# Patient Record
Sex: Female | Born: 1945 | Race: Black or African American | Hispanic: No | Smoking: Never smoker
Health system: Southern US, Community
[De-identification: ages and names within clinical notes are randomized; demographics above are authoritative.]

## PROBLEM LIST (undated history)

## (undated) DIAGNOSIS — E119 Type 2 diabetes mellitus without complications: Secondary | ICD-10-CM

## (undated) DIAGNOSIS — I10 Essential (primary) hypertension: Secondary | ICD-10-CM

## (undated) DIAGNOSIS — H409 Unspecified glaucoma: Secondary | ICD-10-CM

## (undated) DIAGNOSIS — D869 Sarcoidosis, unspecified: Secondary | ICD-10-CM

## (undated) HISTORY — PX: OTHER SURGICAL HISTORY: SHX169

## (undated) HISTORY — PX: ABDOMINAL HYSTERECTOMY: SHX81

---

## 2008-09-19 ENCOUNTER — Encounter: Admission: RE | Admit: 2008-09-19 | Discharge: 2008-09-19 | Payer: Self-pay | Admitting: Internal Medicine

## 2013-01-09 ENCOUNTER — Inpatient Hospital Stay (HOSPITAL_COMMUNITY)

## 2013-01-09 ENCOUNTER — Encounter (HOSPITAL_COMMUNITY): Payer: Self-pay | Admitting: Emergency Medicine

## 2013-01-09 ENCOUNTER — Inpatient Hospital Stay (HOSPITAL_COMMUNITY)
Admission: EM | Admit: 2013-01-09 | Discharge: 2013-01-11 | DRG: 313 | Disposition: A | Discharge status: Home / Self Care | Attending: Internal Medicine | Admitting: Internal Medicine

## 2013-01-09 ENCOUNTER — Emergency Department (HOSPITAL_COMMUNITY)

## 2013-01-09 DIAGNOSIS — I4510 Unspecified right bundle-branch block: Secondary | ICD-10-CM | POA: Diagnosis present

## 2013-01-09 DIAGNOSIS — K219 Gastro-esophageal reflux disease without esophagitis: Secondary | ICD-10-CM | POA: Diagnosis present

## 2013-01-09 DIAGNOSIS — I209 Angina pectoris, unspecified: Secondary | ICD-10-CM

## 2013-01-09 DIAGNOSIS — R0789 Other chest pain: Principal | ICD-10-CM | POA: Diagnosis present

## 2013-01-09 DIAGNOSIS — E119 Type 2 diabetes mellitus without complications: Secondary | ICD-10-CM | POA: Diagnosis present

## 2013-01-09 DIAGNOSIS — I10 Essential (primary) hypertension: Secondary | ICD-10-CM | POA: Diagnosis present

## 2013-01-09 DIAGNOSIS — R072 Precordial pain: Secondary | ICD-10-CM | POA: Diagnosis present

## 2013-01-09 DIAGNOSIS — E669 Obesity, unspecified: Secondary | ICD-10-CM | POA: Diagnosis present

## 2013-01-09 DIAGNOSIS — R1013 Epigastric pain: Secondary | ICD-10-CM | POA: Diagnosis present

## 2013-01-09 DIAGNOSIS — Z8249 Family history of ischemic heart disease and other diseases of the circulatory system: Secondary | ICD-10-CM

## 2013-01-09 DIAGNOSIS — R079 Chest pain, unspecified: Secondary | ICD-10-CM | POA: Diagnosis present

## 2013-01-09 DIAGNOSIS — I519 Heart disease, unspecified: Secondary | ICD-10-CM | POA: Diagnosis present

## 2013-01-09 DIAGNOSIS — E785 Hyperlipidemia, unspecified: Secondary | ICD-10-CM | POA: Diagnosis present

## 2013-01-09 DIAGNOSIS — D869 Sarcoidosis, unspecified: Secondary | ICD-10-CM | POA: Diagnosis present

## 2013-01-09 DIAGNOSIS — I5189 Other ill-defined heart diseases: Secondary | ICD-10-CM | POA: Diagnosis present

## 2013-01-09 DIAGNOSIS — R9431 Abnormal electrocardiogram [ECG] [EKG]: Secondary | ICD-10-CM | POA: Diagnosis present

## 2013-01-09 HISTORY — DX: Unspecified glaucoma: H40.9

## 2013-01-09 HISTORY — DX: Sarcoidosis, unspecified: D86.9

## 2013-01-09 HISTORY — DX: Type 2 diabetes mellitus without complications: E11.9

## 2013-01-09 HISTORY — DX: Essential (primary) hypertension: I10

## 2013-01-09 LAB — CBC
HCT: 39.3 % (ref 36.0–46.0)
HCT: 39 % (ref 36.0–46.0)
Hemoglobin: 13.2 g/dL (ref 12.0–15.0)
MCH: 28.8 pg (ref 26.0–34.0)
MCH: 29.4 pg (ref 26.0–34.0)
MCHC: 34.1 g/dL (ref 30.0–36.0)
MCV: 86.3 fL (ref 78.0–100.0)
RBC: 4.58 MIL/uL (ref 3.87–5.11)
RDW: 13.9 % (ref 11.5–15.5)

## 2013-01-09 LAB — BASIC METABOLIC PANEL
CO2: 26 mEq/L (ref 19–32)
Glucose, Bld: 164 mg/dL — ABNORMAL HIGH (ref 70–99)
Potassium: 3.6 mEq/L (ref 3.5–5.1)
Sodium: 139 mEq/L (ref 135–145)

## 2013-01-09 LAB — POCT I-STAT TROPONIN I: Troponin i, poc: 0.01 ng/mL (ref 0.00–0.08)

## 2013-01-09 LAB — CREATININE, SERUM: GFR calc non Af Amer: 68 mL/min — ABNORMAL LOW (ref >90)

## 2013-01-09 MED ORDER — SODIUM CHLORIDE 0.9 % IV BOLUS (SEPSIS)
1000.0000 mL | Freq: Once | INTRAVENOUS | Status: AC
  Administered 2013-01-09: 1000 mL via INTRAVENOUS

## 2013-01-09 MED ORDER — ENOXAPARIN SODIUM 40 MG/0.4ML ~~LOC~~ SOLN
40.0000 mg | SUBCUTANEOUS | Status: DC
  Administered 2013-01-10 – 2013-01-11 (×2): 40 mg via SUBCUTANEOUS
  Filled 2013-01-09 (×2): qty 0.4

## 2013-01-09 MED ORDER — LOSARTAN POTASSIUM-HCTZ 50-12.5 MG PO TABS: 1.0000 | ORAL_TABLET | Freq: Every day | ORAL | Status: DC

## 2013-01-09 MED ORDER — SODIUM CHLORIDE 0.9 % IV SOLN
INTRAVENOUS | Status: DC
  Administered 2013-01-09: 1000 mL via INTRAVENOUS

## 2013-01-09 MED ORDER — ONDANSETRON HCL 4 MG PO TABS: 4.0000 mg | ORAL_TABLET | Freq: Four times a day (QID) | ORAL | Status: DC | PRN

## 2013-01-09 MED ORDER — HYDROCHLOROTHIAZIDE 12.5 MG PO CAPS
12.5000 mg | ORAL_CAPSULE | Freq: Every day | ORAL | Status: DC
  Administered 2013-01-10 – 2013-01-11 (×2): 12.5 mg via ORAL
  Filled 2013-01-09 (×2): qty 1

## 2013-01-09 MED ORDER — ASPIRIN 325 MG PO TABS
325.0000 mg | ORAL_TABLET | Freq: Every day | ORAL | Status: DC
  Administered 2013-01-10 – 2013-01-11 (×2): 325 mg via ORAL
  Filled 2013-01-09 (×2): qty 1

## 2013-01-09 MED ORDER — PANTOPRAZOLE SODIUM 40 MG PO TBEC
40.0000 mg | DELAYED_RELEASE_TABLET | Freq: Every day | ORAL | Status: DC
  Administered 2013-01-09 – 2013-01-11 (×3): 40 mg via ORAL
  Filled 2013-01-09 (×3): qty 1

## 2013-01-09 MED ORDER — ASPIRIN 81 MG PO CHEW
162.0000 mg | CHEWABLE_TABLET | Freq: Once | ORAL | Status: AC
  Administered 2013-01-09: 162 mg via ORAL
  Filled 2013-01-09: qty 2

## 2013-01-09 MED ORDER — ONDANSETRON HCL 4 MG/2ML IJ SOLN: 4.0000 mg | Freq: Four times a day (QID) | INTRAMUSCULAR | Status: DC | PRN

## 2013-01-09 MED ORDER — INSULIN ASPART 100 UNIT/ML ~~LOC~~ SOLN
0.0000 [IU] | Freq: Three times a day (TID) | SUBCUTANEOUS | Status: DC
  Administered 2013-01-11: 1 [IU] via SUBCUTANEOUS

## 2013-01-09 MED ORDER — INSULIN ASPART 100 UNIT/ML ~~LOC~~ SOLN: 0.0000 [IU] | Freq: Every day | SUBCUTANEOUS | Status: DC

## 2013-01-09 MED ORDER — NITROGLYCERIN 0.4 MG SL SUBL: 0.4000 mg | SUBLINGUAL_TABLET | SUBLINGUAL | Status: DC | PRN

## 2013-01-09 MED ORDER — SODIUM CHLORIDE 0.9 % IJ SOLN
3.0000 mL | Freq: Two times a day (BID) | INTRAMUSCULAR | Status: DC
  Administered 2013-01-09: 3 mL via INTRAVENOUS

## 2013-01-09 MED ORDER — LOSARTAN POTASSIUM 50 MG PO TABS
50.0000 mg | ORAL_TABLET | Freq: Every day | ORAL | Status: DC
  Administered 2013-01-10 – 2013-01-11 (×2): 50 mg via ORAL
  Filled 2013-01-09 (×2): qty 1

## 2013-01-09 NOTE — Progress Notes (Signed)
   CARE MANAGEMENT ED NOTE 01/09/2013  Patient:  Cheyenne, Green   Account Number:  [id number]  Date Initiated:  01/09/2013  Documentation initiated by:  Radford Pax  Subjective/Objective Assessment:   Patient presents to ED with chest pain     Subjective/Objective Assessment Detail:     Action/Plan:   Action/Plan Detail:   Anticipated DC Date:       Status Recommendation to Physician:   Result of Recommendation:    Other ED Services  Consult Working Plan    DC Planning Services  Other  PCP issues    Choice offered to / List presented to:            Status of service:  Completed, signed off  ED Comments:   ED Comments Detail:  Patient reports her pcp is Dr. Ludwig Clarks.

## 2013-01-09 NOTE — ED Notes (Signed)
Pt states that she woke up yesterday with a sore chest.  States that she feels like the muscles are strained but denies injuring it in any way.  Hx of sarcoidosis. 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  Coworkers did an EKG on her and sent her over here.  Pt states that she is always SOB due to sarcoidosis.

## 2013-01-09 NOTE — H&P (Signed)
[facility] History and Physical  Braylynn Lewing RUE:[id number] DOB: September 12, 1945 DOA: 01/09/2013  Referring physician: Dr Rhunette Croft. PCP: Ralene Ok, MD  Specialists: none  Chief Complaint: epigastric pain, substernal pain started in the morning.   HPI: Cheyenne Green is a 67 y.o. female with h/o sarcoidosis, diabetes mellitus, GERD, came in for epigastric and substernal pain  Started this morning, went to urgent care, underwent an EKG was found to have t wave inversions on lead III , avl, v2 and v3 ,. She was sent to ER, and was referred to medical service for evaluation of ACS. She is chest pain free currently, no nausea, vomiting, palpitations, sob, no orthopnea, pnd, pedal edema.   Review of Systems: The patient denies anorexia, fever, weight loss,, vision loss, decreased hearing, hoarseness, syncope, dyspnea on exertion, peripheral edema, balance deficits, hemoptysis,  melena, hematochezia, severe indigestion/heartburn, hematuria, incontinence, genital sores, muscle weakness, suspicious skin lesions, transient blindness, difficulty walking, depression, unusual weight change, abnormal bleeding, enlarged lymph nodes, angioedema, and breast masses.    Past Medical History  Diagnosis Date  . Sarcoidosis   . Diabetes mellitus without complication   . Glaucoma    Past Surgical History  Procedure Laterality Date  . Abdominal hysterectomy     Social History:  reports that she has never smoked. She does not have any smokeless tobacco history on file. She reports that she does not drink alcohol. Her drug history is not on file.  where does patient live--home, No Known Allergies  History reviewed. No pertinent family history.  Family h/o CAD in father and mother.   Prior to Admission medications   Medication Sig Start Date End Date Taking? Authorizing Provider  cholecalciferol (VITAMIN D) 1000 UNITS tablet Take 1,000 Units by mouth daily.   Yes Historical Provider, MD   losartan-hydrochlorothiazide (HYZAAR) 50-12.5 MG per tablet Take 1 tablet by mouth daily.   Yes Historical Provider, MD  metFORMIN (GLUCOPHAGE) 500 MG tablet Take 500 mg by mouth 2 (two) times daily with a meal.   Yes Historical Provider, MD  dexlansoprazole (DEXILANT) 60 MG capsule Take 60 mg by mouth daily.    Historical Provider, MD   Physical Exam: There were no vitals filed for this visit.  Constitutional: Vital signs reviewed.  Patient is a well-developed and well-nourished  in no acute distress and cooperative with exam. Alert and oriented x3.  Head: Normocephalic and atraumatic Mouth: no erythema or exudates, MMM Eyes: PERRL, EOMI, conjunctivae normal, No scleral icterus.  Neck: Supple, Trachea midline normal ROM, No JVD, mass, thyromegaly, or carotid bruit present.  Cardiovascular: RRR, S1 normal, S2 normal, no MRG, pulses symmetric and intact bilaterally Pulmonary/Chest: normal respiratory effort, CTAB, no wheezes, rales, or rhonchi Abdominal: Soft. Non-tender, non-distended, bowel sounds are normal, no masses, organomegaly, or guarding present.  Musculoskeletal: No joint deformities, erythema, or stiffness, ROM full and no nontender Neurological: A&O x3, Strength is normal and symmetric bilaterally, cranial nerve II-XII are grossly intact, no focal motor deficit, sensory intact to light touch bilaterally.  Skin: Warm, dry and intact. No rash, cyanosis, or clubbing.  Psychiatric: Normal mood and affect. speech and behavior is normal.   Labs on Admission:  Basic Metabolic Panel:  Recent Labs Lab 01/09/13 1519  NA 139  K 3.6  CL 102  CO2 26  GLUCOSE 164*  BUN 18  CREATININE 0.89  CALCIUM 9.6   Liver Function Tests: No results found for this basename: AST, ALT, ALKPHOS, BILITOT, PROT, ALBUMIN,  in the last 168 hours  No results found for this basename: LIPASE, AMYLASE,  in the last 168 hours No results found for this basename: AMMONIA,  in the last 168  hours CBC:  Recent Labs Lab 01/09/13 1519  WBC 8.2  HGB 13.2  HCT 39.3  MCV 85.8  PLT 207   Cardiac Enzymes: No results found for this basename: CKTOTAL, CKMB, CKMBINDEX, TROPONINI,  in the last 168 hours  BNP (last 3 results) No results found for this basename: PROBNP,  in the last 8760 hours CBG: No results found for this basename: GLUCAP,  in the last 168 hours  Radiological Exams on Admission: No results found.  EKG: sinus rhythm, RBBB, with t wave inversions in  III, avl, v3 v2.   Assessment/Plan Active Problems:  1.  Chest Pain:  - atypical in nature.  - admit to telemetry.  - aspirin, serial troponins, EKG in am.  - echocardiogram ordered.  - PPI ordered.  -  2. Diabetes Mellitus:  - SSI,  - Hgba1c.   3. GERD; PPI.   4. DVT prophylaxis.   Code Status: full code Family Communication: none at bedside Disposition Plan: pending.   Time spent: 60 min  Royelle Hinchman [facility] Pager [phone]  If 7PM-7AM, please contact night-coverage [email or web address] Password [facility] 01/09/2013, 6:46 PM

## 2013-01-09 NOTE — ED Notes (Signed)
Attempted to call report to floor RN will call back in 5 minutes.

## 2013-01-09 NOTE — ED Provider Notes (Signed)
History    CSN: [id number] Arrival date & time 01/09/13  1446  First MD Initiated Contact with Patient 01/09/13 1508     Chief Complaint  Patient presents with  . Chest Pain   (Consider location/radiation/quality/duration/timing/severity/associated sxs/prior Treatment) HPI Comments: Pt with hx of DM, Sarcoidosis, HTN, family hx of CAD comes in with cc of chest soreness. Pt has mid sternal chest discomfort, described as soreness, that has not gotten better since this morning. The pain is non radiating. There was some nausea in the AM, but no emesis, no diaphoresis. The pain is not exertional, worse with deep inspirations. She always has exertional dib, which she attributes to her sarcoidosis. No hx of PE, DVT, wheezing, cough.  Patient is a 67 y.o. female presenting with chest pain. The history is provided by the patient.  Chest Pain Associated symptoms: shortness of breath   Associated symptoms: no abdominal pain, no cough, no nausea and not vomiting    Past Medical History  Diagnosis Date  . Sarcoidosis   . Diabetes mellitus without complication   . Glaucoma    Past Surgical History  Procedure Laterality Date  . Abdominal hysterectomy     History reviewed. No pertinent family history. History  Substance Use Topics  . Smoking status: Never Smoker   . Smokeless tobacco: Not on file  . Alcohol Use: No   OB History   Grav Para Term Preterm Abortions TAB SAB Ect Mult Living                 Review of Systems  Constitutional: Negative for activity change.  HENT: Negative for facial swelling and neck pain.   Respiratory: Positive for shortness of breath. Negative for cough and wheezing.   Cardiovascular: Positive for chest pain.  Gastrointestinal: Negative for nausea, vomiting, abdominal pain, diarrhea, constipation, blood in stool and abdominal distention.  Genitourinary: Negative for hematuria and difficulty urinating.  Skin: Negative for color change.  Neurological:  Negative for speech difficulty.  Hematological: Does not bruise/bleed easily.  Psychiatric/Behavioral: Negative for confusion.    Allergies  Review of patient's allergies indicates no known allergies.  Home Medications   Current Outpatient Rx  Name  Route  Sig  Dispense  Refill  . cholecalciferol (VITAMIN D) 1000 UNITS tablet   Oral   Take 1,000 Units by mouth daily.         Marland Kitchen losartan-hydrochlorothiazide (HYZAAR) 50-12.5 MG per tablet   Oral   Take 1 tablet by mouth daily.         . metFORMIN (GLUCOPHAGE) 500 MG tablet   Oral   Take 500 mg by mouth 2 (two) times daily with a meal.         . dexlansoprazole (DEXILANT) 60 MG capsule   Oral   Take 60 mg by mouth daily.          There were no vitals taken for this visit. Physical Exam  Nursing note and vitals reviewed. Constitutional: She is oriented to person, place, and time. She appears well-developed and well-nourished.  HENT:  Head: Normocephalic and atraumatic.  Eyes: EOM are normal. Pupils are equal, round, and reactive to light.  Neck: Neck supple. No JVD present.  Cardiovascular: Normal rate, regular rhythm and normal heart sounds.   No murmur heard. Pulmonary/Chest: Effort normal. No respiratory distress. She has no wheezes.  Abdominal: Soft. She exhibits no distension. There is no tenderness. There is no rebound and no guarding.  Neurological: She is alert and  oriented to person, place, and time.  Skin: Skin is warm and dry.    ED Course  Procedures (including critical care time) Labs Reviewed  CBC  BASIC METABOLIC PANEL  D-DIMER, QUANTITATIVE   No results found. No diagnosis found.  MDM   Date: 01/09/2013  Rate: 71  Rhythm: normal sinus rhythm  QRS Axis: normal  Intervals: normal  ST/T Wave abnormalities: nonspecific ST/T changes  Conduction Disutrbances:nonspecific intraventricular conduction delay  Narrative Interpretation:   Old EKG Reviewed: none available  Differential diagnosis  includes: ACS syndrome CHF exacerbation Valvular disorder Myocarditis Pericarditis Pericardial effusion Pneumonia Pleural effusion Pulmonary edema PE Anemia Musculoskeletal pain  Pt comes in with cc of chest pain -described as soreness. She has DM, HTN, Family hx and age as her cardiac risk factors. The pain is atypical, it does have some pleuritic component to it - and she ha sarcoidosis - which if there is an element of pulm HTN, puts her at risk for PE, so we will get a dimer. EKG is not reassuring, as there is incomplete RBBB, and t wave inversions. Will admit for ACS r/o.      Derwood Kaplan, MD 01/09/13 [phone]

## 2013-01-09 NOTE — ED Notes (Signed)
Pt had set of troponin's done. Second one checked off as completed in error.

## 2013-01-10 DIAGNOSIS — E669 Obesity, unspecified: Secondary | ICD-10-CM | POA: Diagnosis present

## 2013-01-10 DIAGNOSIS — E785 Hyperlipidemia, unspecified: Secondary | ICD-10-CM | POA: Diagnosis present

## 2013-01-10 DIAGNOSIS — I517 Cardiomegaly: Secondary | ICD-10-CM

## 2013-01-10 DIAGNOSIS — I209 Angina pectoris, unspecified: Secondary | ICD-10-CM

## 2013-01-10 DIAGNOSIS — I10 Essential (primary) hypertension: Secondary | ICD-10-CM | POA: Diagnosis present

## 2013-01-10 DIAGNOSIS — R9431 Abnormal electrocardiogram [ECG] [EKG]: Secondary | ICD-10-CM | POA: Diagnosis present

## 2013-01-10 DIAGNOSIS — I519 Heart disease, unspecified: Secondary | ICD-10-CM

## 2013-01-10 DIAGNOSIS — Z8249 Family history of ischemic heart disease and other diseases of the circulatory system: Secondary | ICD-10-CM

## 2013-01-10 DIAGNOSIS — D869 Sarcoidosis, unspecified: Secondary | ICD-10-CM | POA: Diagnosis present

## 2013-01-10 DIAGNOSIS — I5189 Other ill-defined heart diseases: Secondary | ICD-10-CM | POA: Diagnosis present

## 2013-01-10 LAB — LIPID PANEL
Cholesterol: 221 mg/dL — ABNORMAL HIGH (ref 0–200)
HDL: 46 mg/dL (ref >39)
LDL Cholesterol: 148 mg/dL — ABNORMAL HIGH (ref 0–99)
Triglycerides: 134 mg/dL (ref <150)
VLDL: 27 mg/dL (ref 0–40)

## 2013-01-10 LAB — TSH: TSH: 3.534 u[IU]/mL (ref 0.350–4.500)

## 2013-01-10 LAB — TROPONIN I: Troponin I: <0.3 ng/mL (ref <0.30)

## 2013-01-10 LAB — HEMOGLOBIN A1C
Hgb A1c MFr Bld: 5.9 % — ABNORMAL HIGH (ref <5.7)
Mean Plasma Glucose: 123 mg/dL — ABNORMAL HIGH (ref <117)

## 2013-01-10 LAB — GLUCOSE, CAPILLARY

## 2013-01-10 MED ORDER — ACETAMINOPHEN 325 MG PO TABS: 650.0000 mg | ORAL_TABLET | Freq: Four times a day (QID) | ORAL | Status: DC | PRN

## 2013-01-10 NOTE — Progress Notes (Signed)
[facility] PROGRESS NOTE  Cheyenne Green ZOX:[id number] DOB: 08-29-1945 DOA: 01/09/2013  PCP: Ralene Ok, MD  Brief HPI: Cheyenne Green is a 67 y.o. female with h/o sarcoidosis, diabetes mellitus, GERD, came in for epigastric and substernal pain that sarted the morning of admission. She works at [employer] and had an EKG done while she was there and was found to have T wave inversions on lead III , avl, v2 and v3. She was sent to ER, and was referred to medical service for evaluation of ACS.   Past medical history:  Past Medical History  Diagnosis Date  . Sarcoidosis   . Diabetes mellitus without complication   . Glaucoma   . Hypertension     preventative    Consultants: [facility]  Procedures: ECHO is pending  Antibiotics: None  Subjective: Patient denies any symptoms currently. No chest pain. But she does mention fatigue for last 2 weeks and epigastric tightness as well. Is chronically short of breath due to sarcoidosis but walks 6 miles on weekends. Denies leg swelling. Last stress test was 5 years ago in [place].  Objective: Vital Signs  Filed Vitals:   01/09/13 1505 01/09/13 1902 01/09/13 2029 01/10/13 0645  BP: 131/72 139/82 152/74 116/68  Pulse: 68 70 85 67  Temp: 98.1 F (36.7 C) 98 F (36.7 C) 97.5 F (36.4 C) 98.1 F (36.7 C)  TempSrc: Oral  Oral Oral  Resp: 18 18 20 18   Height:   5\' 3"  (1.6 m)   Weight:   82.056 kg (180 lb 14.4 oz)   SpO2: 98% 96% 99% 97%    Intake/Output Summary (Last 24 hours) at 01/10/13 0827 Last data filed at 01/10/13 0645  Gross per 24 hour  Intake      0 ml  Output   1400 ml  Net  -1400 ml   Filed Weights   01/09/13 2029  Weight: 82.056 kg (180 lb 14.4 oz)   General appearance: alert, cooperative, appears stated age, no distress and moderately obese Head: Normocephalic, without obvious abnormality, atraumatic Eyes: Redness noted in right eye. Back: symmetric, no curvature. ROM normal. No CVA tenderness. Resp: clear to  auscultation bilaterally Cardio: regular rate and rhythm, S1, S2 normal, no murmur, click, rub or gallop GI: soft, non-tender; bowel sounds normal; no masses,  no organomegaly Extremities: extremities normal, atraumatic, no cyanosis or edema Pulses: 2+ and symmetric Neurologic: Alert and oriented x 3. No focal deficits.  Lab Results:  Basic Metabolic Panel:  Recent Labs Lab 01/09/13 1519 01/09/13 2052  NA 139  --   K 3.6  --   CL 102  --   CO2 26  --   GLUCOSE 164*  --   BUN 18  --   CREATININE 0.89 0.88  CALCIUM 9.6  --    CBC:  Recent Labs Lab 01/09/13 1519 01/09/13 2052  WBC 8.2 8.8  HGB 13.2 13.3  HCT 39.3 39.0  MCV 85.8 86.3  PLT 207 203   Cardiac Enzymes:  Recent Labs Lab 01/09/13 2052 01/10/13 0225 01/10/13 0755  TROPONINI <0.30 <0.30 <0.30   BNP (last 3 results) No results found for this basename: PROBNP,  in the last 8760 hours CBG:  Recent Labs Lab 01/09/13 2054 01/10/13 0754  GLUCAP 119* 99    No results found for this or any previous visit (from the past 240 hour(s)).    Studies/Results: X-ray Chest Pa And Lateral   01/09/2013   *RADIOLOGY REPORT*  Clinical Data: Chest pain.  CHEST - 2 VIEW  Comparison: Chest 09/19/2008.  Findings: Lungs are clear.  Heart size is normal.  No pneumothorax or pleural fluid.  IMPRESSION: No acute disease.   Original Report Authenticated By: Holley Dexter, M.D.    Medications:  Scheduled: . aspirin  325 mg Oral Daily  . enoxaparin (LOVENOX) injection  40 mg Subcutaneous Q24H  . losartan  50 mg Oral Daily   And  . hydrochlorothiazide  12.5 mg Oral Daily  . insulin aspart  0-5 Units Subcutaneous QHS  . insulin aspart  0-9 Units Subcutaneous TID WC  . pantoprazole  40 mg Oral Daily  . sodium chloride  3 mL Intravenous Q12H   Continuous: . sodium chloride 1,000 mL (01/09/13 2225)   ZOX:[id number], ondansetron (ZOFRAN) IV, ondansetron  Assessment/Plan:  Active Problems:   Chest pain,  unspecified   Diabetes   GERD (gastroesophageal reflux disease)    Chest pain Has rule out for ACS. But she does have risk factors for CAD and has T inversions on EKG. She will need further testing to rule out CAD. Will consult cardiology. Timing of testing to be determined by them. Continue aspirin. Check Lipid panel.  Diabetes Mellitus Type 2 SSI. Holding Metformin. AIC 5.9.  History of Hypertension Continue current meds. Monitor Bp closely.  History of Sarcoidosis Stable. Used to be on Prednisone in past but not currently.  Code Status: Full Code  DVT Prophylaxis:   Enoxaparin Family Communication: Discussed with patient. Disposition Plan: Home when ready.    LOS: 1 day   [facility]  [facility] Pager [phone] 01/10/2013, 8:27 AM  If 8PM-8AM, please contact night-coverage at [email or web address], password [facility]

## 2013-01-10 NOTE — Progress Notes (Signed)
*  PRELIMINARY RESULTS* Echocardiogram 2D Echocardiogram has been performed.  Jeryl Columbia 01/10/2013, 9:58 AM

## 2013-01-10 NOTE — Consult Note (Signed)
Reason for Consult: Chest pain  Requesting Physician: Claybon Jabs  HPI: This is a 67 y.o. female followed by Dr Ralene Ok with a past medical history significant for DM, HTN, untreated dyslipidemia, sarcoidosis, and a family history of early CAD, we are asked to see for chest pain. Pt was admitted 01/09/13 after she developed epigastric pain and "soreness". This actually woke her up. She also complained of a bandlike chest discomfort. Her EKG showed a new incomplete RBBB. She was admitted for further evaluation. Troponin are negative. She says today she  Has chest wall tenderness. She denies any unusual SOB or radiation to her arms or jaw.   PMHx:  Past Medical History  Diagnosis Date  . Sarcoidosis   . Diabetes mellitus without complication   . Glaucoma   . Hypertension     preventative   Past Surgical History  Procedure Laterality Date  . Abdominal hysterectomy    . Retinal detachment, retinal bleed,       see eye specialist every 3 month  . Uv-idititis      FAMHx: History reviewed. No pertinent family history.  SOCHx:  reports that she has never smoked. She does not have any smokeless tobacco history on file. She reports that she does not drink alcohol. Her drug history is not on file.  ALLERGIES: Allergies  Allergen Reactions  . Statins     Myalgia, muscle weakness    ROS: Pertinent items are noted in HPI. She also complained to me of constipation, bloating after meals, and neuropathy "tingling" in her feet  HOME MEDICATIONS: Prescriptions prior to admission  Medication Sig Dispense Refill  . cholecalciferol (VITAMIN D) 1000 UNITS tablet Take 1,000 Units by mouth daily.      Marland Kitchen losartan-hydrochlorothiazide (HYZAAR) 50-12.5 MG per tablet Take 1 tablet by mouth daily.      . metFORMIN (GLUCOPHAGE) 500 MG tablet Take 500 mg by mouth 2 (two) times daily with a meal.      . dexlansoprazole (DEXILANT) 60 MG capsule Take 60 mg by mouth daily.        HOSPITAL  MEDICATIONS: I have reviewed the patient's current medications.  VITALS: Blood pressure 134/62, pulse 64, temperature 98.1 F (36.7 C), temperature source Oral, resp. rate 18, height 5\' 3"  (1.6 m), weight 82.056 kg (180 lb 14.4 oz), SpO2 98.00%.  PHYSICAL EXAM: (Per Dr Allyson Sabal) General appearance: alert, cooperative, appears stated age, no distress and moderately obese Neck: no carotid bruit and no JVD Lungs: clear to auscultation bilaterally Heart: regular rate and rhythm Abdomen: obese, non tender Extremities: extremities normal, atraumatic, no cyanosis or edema Pulses: 2+ and symmetric Skin: Skin color, texture, turgor normal. No rashes or lesions Neurologic: Grossly normal  LABS: Results for orders placed during the hospital encounter of 01/09/13 (from the past 48 hour(s))  CBC     Status: None   Collection Time    01/09/13  3:19 PM      Result Value Range   WBC 8.2  4.0 - 10.5 K/uL   RBC 4.58  3.87 - 5.11 MIL/uL   Hemoglobin 13.2  12.0 - 15.0 g/dL   HCT 09.8  11.9 - 14.7 %   MCV 85.8  78.0 - 100.0 fL   MCH 28.8  26.0 - 34.0 pg   MCHC 33.6  30.0 - 36.0 g/dL   RDW 82.9  56.2 - 13.0 %   Platelets 207  150 - 400 K/uL  BASIC METABOLIC PANEL     Status: Abnormal  Collection Time    01/09/13  3:19 PM      Result Value Range   Sodium 139  135 - 145 mEq/L   Potassium 3.6  3.5 - 5.1 mEq/L   Chloride 102  96 - 112 mEq/L   CO2 26  19 - 32 mEq/L   Glucose, Bld 164 (*) 70 - 99 mg/dL   BUN 18  6 - 23 mg/dL   Creatinine, Ser 1.61  0.50 - 1.10 mg/dL   Calcium 9.6  8.4 - 09.6 mg/dL   GFR calc non Af Amer 67 (*) >90 mL/min   GFR calc Af Amer 78 (*) >90 mL/min   Comment:            The eGFR has been calculated     using the CKD EPI equation.     This calculation has not been     validated in all clinical     situations.     eGFR's persistently     <90 mL/min signify     possible Chronic Kidney Disease.  D-DIMER, QUANTITATIVE     Status: None   Collection Time    01/09/13   3:19 PM      Result Value Range   D-Dimer, Quant <0.27  0.00 - 0.48 ug/mL-FEU   Comment:            AT THE INHOUSE ESTABLISHED CUTOFF     VALUE OF 0.48 ug/mL FEU,     THIS ASSAY HAS BEEN DOCUMENTED     IN THE LITERATURE TO HAVE     A SENSITIVITY AND NEGATIVE     PREDICTIVE VALUE OF AT LEAST     98 TO 99%.  THE TEST RESULT     SHOULD BE CORRELATED WITH     AN ASSESSMENT OF THE CLINICAL     PROBABILITY OF DVT / VTE.  POCT I-STAT TROPONIN I     Status: None   Collection Time    01/09/13  3:27 PM      Result Value Range   Troponin i, poc 0.01  0.00 - 0.08 ng/mL   Comment 3            Comment: Due to the release kinetics of cTnI,     a negative result within the first hours     of the onset of symptoms does not rule out     myocardial infarction with certainty.     If myocardial infarction is still suspected,     repeat the test at appropriate intervals.  PREGNANCY, URINE     Status: None   Collection Time    01/09/13  5:14 PM      Result Value Range   Preg Test, Ur NEGATIVE  NEGATIVE   Comment:            THE SENSITIVITY OF THIS     METHODOLOGY IS >20 mIU/mL.  CBC     Status: None   Collection Time    01/09/13  8:52 PM      Result Value Range   WBC 8.8  4.0 - 10.5 K/uL   RBC 4.52  3.87 - 5.11 MIL/uL   Hemoglobin 13.3  12.0 - 15.0 g/dL   HCT 04.5  40.9 - 81.1 %   MCV 86.3  78.0 - 100.0 fL   MCH 29.4  26.0 - 34.0 pg   MCHC 34.1  30.0 - 36.0 g/dL   RDW 91.4  78.2 - 95.6 %  Platelets 203  150 - 400 K/uL  CREATININE, SERUM     Status: Abnormal   Collection Time    01/09/13  8:52 PM      Result Value Range   Creatinine, Ser 0.88  0.50 - 1.10 mg/dL   GFR calc non Af Amer 68 (*) >90 mL/min   GFR calc Af Amer 79 (*) >90 mL/min   Comment:            The eGFR has been calculated     using the CKD EPI equation.     This calculation has not been     validated in all clinical     situations.     eGFR's persistently     <90 mL/min signify     possible Chronic Kidney  Disease.  TROPONIN I     Status: None   Collection Time    01/09/13  8:52 PM      Result Value Range   Troponin I <0.30  <0.30 ng/mL   Comment:            Due to the release kinetics of cTnI,     a negative result within the first hours     of the onset of symptoms does not rule out     myocardial infarction with certainty.     If myocardial infarction is still suspected,     repeat the test at appropriate intervals.  TSH     Status: None   Collection Time    01/09/13  8:52 PM      Result Value Range   TSH 3.534  0.350 - 4.500 uIU/mL  HEMOGLOBIN A1C     Status: Abnormal   Collection Time    01/09/13  8:52 PM      Result Value Range   Hemoglobin A1C 5.9 (*) <5.7 %   Comment: (NOTE)                                                                               According to the ADA Clinical Practice Recommendations for 2011, when     HbA1c is used as a screening test:      >=6.5%   Diagnostic of Diabetes Mellitus               (if abnormal result is confirmed)     5.7-6.4%   Increased risk of developing Diabetes Mellitus     References:Diagnosis and Classification of Diabetes Mellitus,Diabetes     Care,2011,34(Suppl 1):S62-S69 and Standards of Medical Care in             Diabetes - 2011,Diabetes Care,2011,34 (Suppl 1):S11-S61.   Mean Plasma Glucose 123 (*) <117 mg/dL  GLUCOSE, CAPILLARY     Status: Abnormal   Collection Time    01/09/13  8:54 PM      Result Value Range   Glucose-Capillary 119 (*) 70 - 99 mg/dL  TROPONIN I     Status: None   Collection Time    01/10/13  2:25 AM      Result Value Range   Troponin I <0.30  <0.30 ng/mL   Comment:  Due to the release kinetics of cTnI,     a negative result within the first hours     of the onset of symptoms does not rule out     myocardial infarction with certainty.     If myocardial infarction is still suspected,     repeat the test at appropriate intervals.  GLUCOSE, CAPILLARY     Status: None   Collection Time     01/10/13  7:54 AM      Result Value Range   Glucose-Capillary 99  70 - 99 mg/dL  TROPONIN I     Status: None   Collection Time    01/10/13  7:55 AM      Result Value Range   Troponin I <0.30  <0.30 ng/mL   Comment:            Due to the release kinetics of cTnI,     a negative result within the first hours     of the onset of symptoms does not rule out     myocardial infarction with certainty.     If myocardial infarction is still suspected,     repeat the test at appropriate intervals.  LIPID PANEL     Status: Abnormal   Collection Time    01/10/13  7:55 AM      Result Value Range   Cholesterol 221 (*) 0 - 200 mg/dL   Triglycerides 782  <956 mg/dL   HDL 46  >21 mg/dL   Total CHOL/HDL Ratio 4.8     VLDL 27  0 - 40 mg/dL   LDL Cholesterol 308 (*) 0 - 99 mg/dL   Comment:            Total Cholesterol/HDL:CHD Risk     Coronary Heart Disease Risk Table                         Men   Women      1/2 Average Risk   3.4   3.3      Average Risk       5.0   4.4      2 X Average Risk   9.6   7.1      3 X Average Risk  23.4   11.0                Use the calculated Patient Ratio     above and the CHD Risk Table     to determine the patient's CHD Risk.                ATP III CLASSIFICATION (LDL):      <100     mg/dL   Optimal      657-846  mg/dL   Near or Above                        Optimal      130-159  mg/dL   Borderline      962-952  mg/dL   High      >841     mg/dL   Very High  GLUCOSE, CAPILLARY     Status: None   Collection Time    01/10/13 12:01 PM      Result Value Range   Glucose-Capillary 85  70 - 99 mg/dL    EKG: NSR, incomplete RBBB  IMAGING: X-ray Chest Pa And Lateral   01/09/2013   *  RADIOLOGY REPORT*  Clinical Data: Chest pain.  CHEST - 2 VIEW  Comparison: Chest 09/19/2008.  Findings: Lungs are clear.  Heart size is normal.  No pneumothorax or pleural fluid.  IMPRESSION: No acute disease.   Original Report Authenticated By: Holley Dexter, M.D.     IMPRESSION: Principal Problem:   Chest pain with moderate risk of acute coronary syndrome Active Problems:   Diabetes   Diastolic dysfunction, grade 2 by echo 01/10/13   HTN (hypertension)   Nonspecific abnormal EKG- incomplete RBBB (new)   GERD (gastroesophageal reflux disease)   Dyslipidemia- LDL 140, untreated with statin intol   Sarcoidosis   Family history of coronary artery disease, (F died 60)   Obesity, unspecified   RECOMMENDATION: Pt seen by Dr Allyson Sabal and myself. He recommends [facility] and this will be arranged for 7/3 am.  Time Spent Directly with Patient: 40 minutes  Abelino Derrick [phone] beeper 01/10/2013, 12:28 PM   Agree with note written by Corine Shelter PAC  Atyp CP with + CRF. Enz neg. EKG with new RBBB. Exam benign. Will check d-dimer. Plan MPI tomorrow.  Runell Gess 01/10/2013 12:39 PM

## 2013-01-10 NOTE — Care Management Note (Signed)
    Page 1 of 1   01/10/2013     12:12:18 PM   CARE MANAGEMENT NOTE 01/10/2013  Patient:  Cheyenne Green, Cheyenne Green   Account Number:  [id number]  Date Initiated:  01/10/2013  Documentation initiated by:  Lanier Clam  Subjective/Objective Assessment:   ADMITTED W/CHEST PAIN.T WAVE INVERSION.     Action/Plan:   FROM HOME.   Anticipated DC Date:  01/12/2013   Anticipated DC Plan:  HOME/SELF CARE      DC Planning Services  CM consult      Choice offered to / List presented to:             Status of service:  In process, will continue to follow Medicare Important Message given?   (If response is "NO", the following Medicare IM given date [place] will be blank) Date Medicare IM given:   Date Additional Medicare IM given:    Discharge Disposition:    Per UR Regulation:  Reviewed for med. necessity/level of care/duration of stay  If discussed at Long Length of Stay Meetings, dates discussed:    Comments:  01/10/13 [facility] RN,BSN NCM [phone]

## 2013-01-11 ENCOUNTER — Other Ambulatory Visit: Payer: Self-pay

## 2013-01-11 ENCOUNTER — Inpatient Hospital Stay (HOSPITAL_COMMUNITY)
Admit: 2013-01-11 | Discharge: 2013-01-11 | Disposition: A | Discharge status: Home / Self Care | Attending: Cardiology | Admitting: Cardiology

## 2013-01-11 DIAGNOSIS — D869 Sarcoidosis, unspecified: Secondary | ICD-10-CM

## 2013-01-11 LAB — BASIC METABOLIC PANEL
BUN: 17 mg/dL (ref 6–23)
Chloride: 103 mEq/L (ref 96–112)
GFR calc Af Amer: 82 mL/min — ABNORMAL LOW (ref >90)
Glucose, Bld: 124 mg/dL — ABNORMAL HIGH (ref 70–99)
Potassium: 3.9 mEq/L (ref 3.5–5.1)
Sodium: 139 mEq/L (ref 135–145)

## 2013-01-11 LAB — GLUCOSE, CAPILLARY

## 2013-01-11 MED ORDER — ASPIRIN EC 81 MG PO TBEC: 81.0000 mg | DELAYED_RELEASE_TABLET | Freq: Every day | ORAL | Status: AC

## 2013-01-11 MED ORDER — REGADENOSON 0.4 MG/5ML IV SOLN
INTRAVENOUS | Status: AC
  Filled 2013-01-11: qty 5

## 2013-01-11 MED ORDER — REGADENOSON 0.4 MG/5ML IV SOLN
0.4000 mg | Freq: Once | INTRAVENOUS | Status: AC
  Administered 2013-01-11: 0.4 mg via INTRAVENOUS

## 2013-01-11 MED ORDER — TECHNETIUM TC 99M SESTAMIBI GENERIC - CARDIOLITE
10.0000 | Freq: Once | INTRAVENOUS | Status: AC | PRN
  Administered 2013-01-11: 10 via INTRAVENOUS

## 2013-01-11 MED ORDER — TECHNETIUM TC 99M SESTAMIBI GENERIC - CARDIOLITE
30.0000 | Freq: Once | INTRAVENOUS | Status: AC | PRN
  Administered 2013-01-11: 30 via INTRAVENOUS

## 2013-01-11 NOTE — Progress Notes (Addendum)
[facility] PROGRESS NOTE  [name] ZOX:[id number] DOB: 06-Sep-1945 DOA: 01/09/2013  PCP: Ralene Ok, MD  Brief HPI: Cheyenne Green is a 67 y.o. female with h/o sarcoidosis, diabetes mellitus, GERD, came in for epigastric and substernal pain that sarted the morning of admission. She works at [employer] and had an EKG done while she was there and was found to have T wave inversions on lead III , avl, v2 and v3. She was sent to ER, and was referred to medical service for evaluation of ACS.   Past medical history:  Past Medical History  Diagnosis Date  . Sarcoidosis   . Diabetes mellitus without complication   . Glaucoma   . Hypertension     preventative    Consultants: [facility]  Procedures:  2D ECHO 7/2 Study Conclusions - Left ventricle: The cavity size was normal. Wall thickness was increased in a pattern of mild LVH. Systolic function was normal. The estimated ejection fraction was in the range of 55% to 60%. Wall motion was normal; there were no regional wall motion abnormalities. Features are consistent with a pseudonormal left ventricular filling pattern, with concomitant abnormal relaxation and increased filling pressure (grade 2 diastolic dysfunction). - Aortic valve: There was no stenosis. - Mitral valve: Trivial regurgitation. - Left atrium: The atrium was mildly dilated. - Right ventricle: The cavity size was normal. Systolic function was normal. - Pulmonary arteries: No complete TR doppler jet so unable to estimate PA systolic pressure. - Inferior vena cava: The vessel was normal in size; the respirophasic diameter changes were in the normal range (= 50%); findings are consistent with normal central venous pressure.   Antibiotics: None  Subjective: Patient denies any chest pain. No events overnight.   Objective: Vital Signs  Filed Vitals:   01/10/13 1100 01/10/13 1347 01/10/13 2145 01/11/13 0607  BP: 134/62 138/77 120/62 138/73  Pulse: 64 67 77 67  Temp:  98.1 F (36.7 C) 98.2 F (36.8 C) 97.3 F (36.3 C) 98.2 F (36.8 C)  TempSrc: Oral Oral Oral Oral  Resp:  18 18 18   Height:      Weight:      SpO2: 98% 100% 96% 99%    Intake/Output Summary (Last 24 hours) at 01/11/13 0726 Last data filed at 01/11/13 8119  Gross per 24 hour  Intake 1507.5 ml  Output   3750 ml  Net -2242.5 ml   Filed Weights   01/09/13 2029  Weight: 82.056 kg (180 lb 14.4 oz)   General appearance: alert, cooperative, appears stated age, no distress and moderately obese Eyes: Redness noted in right eye. Resp: clear to auscultation bilaterally Cardio: regular rate and rhythm, S1, S2 normal, no murmur, click, rub or gallop GI: soft, non-tender; bowel sounds normal; no masses,  no organomegaly Extremities: extremities normal, atraumatic, no cyanosis or edema Neurologic: Alert and oriented x 3. No focal deficits.  Lab Results:  Basic Metabolic Panel:  Recent Labs Lab 01/09/13 1519 01/09/13 2052  NA 139  --   K 3.6  --   CL 102  --   CO2 26  --   GLUCOSE 164*  --   BUN 18  --   CREATININE 0.89 0.88  CALCIUM 9.6  --    CBC:  Recent Labs Lab 01/09/13 1519 01/09/13 2052  WBC 8.2 8.8  HGB 13.2 13.3  HCT 39.3 39.0  MCV 85.8 86.3  PLT 207 203   Cardiac Enzymes:  Recent Labs Lab 01/09/13 2052 01/10/13 0225 01/10/13 0755  TROPONINI <0.30 <0.30 <0.30   BNP (last 3 results) No results found for this basename: PROBNP,  in the last 8760 hours CBG:  Recent Labs Lab 01/09/13 2054 01/10/13 0754 01/10/13 1201 01/10/13 1709 01/10/13 2141  GLUCAP 119* 99 85 107* 118*    No results found for this or any previous visit (from the past 240 hour(s)).    Studies/Results: X-ray Chest Pa And Lateral   01/09/2013   *RADIOLOGY REPORT*  Clinical Data: Chest pain.  CHEST - 2 VIEW  Comparison: Chest 09/19/2008.  Findings: Lungs are clear.  Heart size is normal.  No pneumothorax or pleural fluid.  IMPRESSION: No acute disease.   Original Report  Authenticated By: Holley Dexter, M.D.    Medications:  Scheduled: . aspirin  325 mg Oral Daily  . enoxaparin (LOVENOX) injection  40 mg Subcutaneous Q24H  . losartan  50 mg Oral Daily   And  . hydrochlorothiazide  12.5 mg Oral Daily  . insulin aspart  0-5 Units Subcutaneous QHS  . insulin aspart  0-9 Units Subcutaneous TID WC  . pantoprazole  40 mg Oral Daily  . sodium chloride  3 mL Intravenous Q12H   Continuous: . sodium chloride 1,000 mL (01/09/13 2225)   EAV:[id number], nitroGLYCERIN, ondansetron (ZOFRAN) IV, ondansetron  Assessment/Plan:  Principal Problem:   Chest pain with moderate risk of acute coronary syndrome Active Problems:   Diabetes   GERD (gastroesophageal reflux disease)   Diastolic dysfunction, grade 2 by echo 01/10/13   Dyslipidemia- LDL 140, untreated with statin intol   HTN (hypertension)   Nonspecific abnormal EKG- incomplete RBBB (new)   Sarcoidosis   Family history of coronary artery disease, (F died 66)   Obesity, unspecified    Chest pain Has rule out for ACS. But she does have risk factors for CAD and has T inversions on EKG. She has been seen by cardiology and plan is for stress test today. ECHO shows diastolic dysfunction and she was notified of the results. Continue aspirin. Lipid panel shows LDL 148, HDL 46. She meets indication for treatment due to DM2 but is allergic to statin due to myalgias. Will defer to PCP and cards at this time.  Diabetes Mellitus Type 2 SSI. Holding Metformin. AIC 5.9. CBG stable.  History of Hypertension Continue current meds. Monitor Bp closely.  History of Sarcoidosis Stable. Used to be on Prednisone in past but not currently.  H/O Uveitis On eye drops which she is taking from home. Will ask Rn to document same.  Code Status: Full Code  DVT Prophylaxis:   Enoxaparin Family Communication: Discussed with patient. Disposition Plan: Home later today if stress test is low risk.    LOS: 2 days    [facility]  [facility] Pager [phone] 01/11/2013, 7:26 AM  If 8PM-8AM, please contact night-coverage at [email or web address], password [facility]

## 2013-01-11 NOTE — Discharge Summary (Signed)
[facility]  Physician Discharge Summary   Patient ID: Cheyenne Green MRN: [id number] DOB/AGE: 65-Feb-1947 65 y.o.  Admit date: 01/09/2013 Discharge date: 01/11/2013  PCP: Ralene Ok, MD  DISCHARGE DIAGNOSES:  Principal Problem:   Chest pain with moderate risk of acute coronary syndrome Active Problems:   Diabetes   GERD (gastroesophageal reflux disease)   Diastolic dysfunction, grade 2 by echo 01/10/13   Dyslipidemia- LDL 140, untreated with statin intol   HTN (hypertension)   Nonspecific abnormal EKG- incomplete RBBB (new)   Sarcoidosis   Family history of coronary artery disease, (F died 76)   Obesity, unspecified   RECOMMENDATIONS FOR OUTPATIENT FOLLOW UP: 1. Has elevated Cholesterol. Please address in view of Statin intolerance. 2. Newly diagnosed Diastolic Dysfunction  DISCHARGE CONDITION: fair  Diet recommendation: Mod Carb  Filed Weights   01/09/13 2029  Weight: 82.056 kg (180 lb 14.4 oz)    INITIAL HISTORY: Cheyenne Green is a 67 y.o. female with h/o sarcoidosis, diabetes mellitus, GERD, came in for epigastric and substernal pain that sarted the morning of admission. She works at [employer] and had an EKG done while she was there and was found to have T wave inversions on lead III , avl, v2 and v3. She was sent to ER, and was referred to medical service for evaluation of ACS.   Consultations:  [facility]  Procedures: Nuclear Stress Test 01/11/13 Normal myocardial perfusion examination. No evidence for pharmacologically induced ischemia. Normal wall motion and calculated ejection fraction is 73%.  2D ECHO 7/2  Study Conclusions - Left ventricle: The cavity size was normal. Wall thickness was increased in a pattern of mild LVH. Systolic function was normal. The estimated ejection fraction was in the range of 55% to 60%. Wall motion was normal; there were no regional wall motion abnormalities. Features are consistent with a pseudonormal  left ventricular filling pattern, with concomitant abnormal relaxation and increased filling pressure (grade 2 diastolic dysfunction). - Aortic valve: There was no stenosis. - Mitral valve: Trivial regurgitation. - Left atrium: The atrium was mildly dilated. - Right ventricle: The cavity size was normal. Systolic function was normal. - Pulmonary arteries: No complete TR doppler jet so unable to estimate PA systolic pressure. - Inferior vena cava: The vessel was normal in size; the respirophasic diameter changes were in the normal range (= 50%); findings are consistent with normal central venous pressure.   HOSPITAL COURSE:   Chest pain  Patient was admitted to the hospital to rule out ACS. Her troponins were normal. Due to non specific EKG's changes she was seen by cardiology and she underwent a stress test which was low risk as stated above. ECHO report is as above. Patient to follow up with PCP for the same. Further follow up with cardiology per PCP. Continue aspirin. Lipid panel shows LDL 148, HDL 46. She meets indication for treatment due to DM2 but is allergic to statin due to myalgias. Will defer to PCP at this time. Patient has been told of her test results and her questions were answered. She remains chest pain free at this time. Etiology for her symptoms remains unclear. She should continue her PPI. Her D dimer was normal.  Diabetes Mellitus Type 2  She may resume her Metformin. AIC 5.9. CBG stable.  History of Hypertension  She may continue home medications.   History of Sarcoidosis  This is stable. She used to be on Prednisone in past but not currently.   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 Follows with a Ophthalmologist at [facility]. She should continue her eye drops.   Patient remains stable. In view of negative stress test she can be discharged home.  PERTINENT LABS:  The results of significant diagnostics from this hospitalization (including imaging,  microbiology, ancillary and laboratory) are listed below for reference.     Labs: Basic Metabolic Panel:  Recent Labs Lab 01/09/13 1519 01/09/13 2052 01/11/13 0755  NA 139  --  139  K 3.6  --  3.9  CL 102  --  103  CO2 26  --  28  GLUCOSE 164*  --  124*  BUN 18  --  17  CREATININE 0.89 0.88 0.85  CALCIUM 9.6  --  9.4   CBC:  Recent Labs Lab 01/09/13 1519 01/09/13 2052  WBC 8.2 8.8  HGB 13.2 13.3  HCT 39.3 39.0  MCV 85.8 86.3  PLT 207 203   Cardiac Enzymes:  Recent Labs Lab 01/09/13 2052 01/10/13 0225 01/10/13 0755  TROPONINI <0.30 <0.30 <0.30   CBG:  Recent Labs Lab 01/10/13 0754 01/10/13 1201 01/10/13 1709 01/10/13 2141 01/11/13 0736  GLUCAP 99 85 107* 118* 106*     IMAGING STUDIES X-ray Chest Pa And Lateral   01/09/2013   *RADIOLOGY REPORT*  Clinical Data: Chest pain.  CHEST - 2 VIEW  Comparison: Chest 09/19/2008.  Findings: Lungs are clear.  Heart size is normal.  No pneumothorax or pleural fluid.  IMPRESSION: No acute disease.   Original Report Authenticated By: Holley Dexter, M.D.   Nm Myocar Multi W/spect W/wall Motion / Ef  01/11/2013   *RADIOLOGY REPORT*  Clinical Data:  Chest pain.  Technique:  Standard myocardial SPECT imaging performed after resting intravenous injection of 10 mCi Tc-73m sestimibi. Subsequently, intravenous infusion of regadenoson performed under the supervision of the Cardiology staff.  At peak effect of the drug, 30 mCi Tc-61m sestimibi injected intravenously and standard myocardial SPECT imaging performed.  Quantitative gated imaging also performed to evaluate left ventricular wall motion and estimate left ventricular ejection fraction.  Comparison:  None  MYOCARDIAL IMAGING WITH SPECT (REST AND PHARMACOLOGIC-STRESS)  Findings:  The myocardial perfusion in the left ventricle is normal.  No evidence for a fixed or reversible defect.  GATED LEFT VENTRICULAR WALL MOTION STUDY  Findings:  Review of the gated images demonstrates  normal wall motion.  LEFT VENTRICULAR EJECTION FRACTION  Findings:  QGS ejection fraction measures 73% , with an end- diastolic volume of 71 ml and an end-systolic volume of 19 ml.  IMPRESSION: Normal myocardial perfusion examination.  No evidence for pharmacologically induced ischemia.  Normal wall motion and calculated ejection fraction is 73%.   Original Report Authenticated By: Richarda Overlie, M.D.    DISCHARGE EXAMINATION: See Progress note from earlier today  DISPOSITION: Home  Discharge Orders   Future Orders Complete By Expires     Diet Carb Modified  As directed     Discharge instructions  As directed     Comments:      Follow up with your PCP to discuss elevated cholesterol and diastolic dysfunction    Increase activity slowly  As directed        ALLERGIES:  Allergies  Allergen Reactions  . Statins     Myalgia, muscle weakness    Current Discharge Medication List    START taking these medications   Details  aspirin EC 81 MG tablet Take 1 tablet (81 mg total) by mouth daily. Qty: 30 tablet,  Refills: 3      CONTINUE these medications which have NOT CHANGED   Details  brimonidine (ALPHAGAN P) 0.1 % SOLN Place 1 drop into both eyes 2 (two) times daily.    cholecalciferol (VITAMIN D) 1000 UNITS tablet Take 1,000 Units by mouth daily.    Difluprednate (DUREZOL) 0.05 % EMUL Place 1 drop into both eyes 2 (two) times daily.     losartan-hydrochlorothiazide (HYZAAR) 50-12.5 MG per tablet Take 1 tablet by mouth daily.    metFORMIN (GLUCOPHAGE) 500 MG tablet Take 500 mg by mouth 2 (two) times daily with a meal.    dexlansoprazole (DEXILANT) 60 MG capsule Take 60 mg by mouth daily.       Follow-up Information   Follow up with Ralene Ok, MD. Schedule an appointment as soon as possible for a visit in 5 days.   Contact information:   [address] [place] [address] [phone]       TOTAL DISCHARGE TIME: 35 mins  [facility]  [facility] Pager  [phone]  01/11/2013, 1:46 PM

## 2013-01-11 NOTE — Progress Notes (Signed)
Agree with the NP/PA-C note as written.  NST today revealed no ischemia, EF 73%.  Ok for discharge and follow-up with Dr. Allyson Sabal or MLP in the office in 7-10 days.  Chrystie Nose, MD, [facility] Attending Cardiologist [facility]    Chrystie Nose, MD, [facility] Attending Cardiologist [facility]

## 2013-01-11 NOTE — Progress Notes (Signed)
Subjective:  No chest pain  Objective:  Vital Signs in the last 24 hours: Temp:  [97.3 F (36.3 C)-98.2 F (36.8 C)] 98.2 F (36.8 C) (07/03 0607) Pulse Rate:  [64-77] 67 (07/03 0607) Resp:  [18] 18 (07/03 0607) BP: (120-138)/(60-77) 138/73 mmHg (07/03 0607) SpO2:  [96 %-100 %] 99 % (07/03 0607)  Intake/Output from previous day:  Intake/Output Summary (Last 24 hours) at 01/11/13 1000 Last data filed at 01/11/13 0607  Gross per 24 hour  Intake 1507.5 ml  Output   3750 ml  Net -2242.5 ml    Physical Exam: General appearance: alert, cooperative and no distress Lungs: clear to auscultation bilaterally Heart: regular rate and rhythm   Rate: 70  Rhythm: normal sinus rhythm  Lab Results:  Recent Labs  01/09/13 1519 01/09/13 2052  WBC 8.2 8.8  HGB 13.2 13.3  PLT 207 203    Recent Labs  01/09/13 1519 01/09/13 2052 01/11/13 0755  NA 139  --  139  K 3.6  --  3.9  CL 102  --  103  CO2 26  --  28  GLUCOSE 164*  --  124*  BUN 18  --  17  CREATININE 0.89 0.88 0.85    Recent Labs  01/10/13 0225 01/10/13 0755  TROPONINI <0.30 <0.30   Hepatic Function Panel No results found for this basename: PROT, ALBUMIN, AST, ALT, ALKPHOS, BILITOT, BILIDIR, IBILI,  in the last 72 hours  Recent Labs  01/10/13 0755  CHOL 221*   No results found for this basename: INR,  in the last 72 hours  Imaging: Imaging results have been reviewed  Cardiac Studies:  Assessment/Plan:   Principal Problem:   Chest pain with moderate risk of acute coronary syndrome Active Problems:   Diabetes   Diastolic dysfunction, grade 2 by echo 01/10/13   HTN (hypertension)   Nonspecific abnormal EKG- incomplete RBBB (new)   GERD (gastroesophageal reflux disease)   Dyslipidemia- LDL 140, untreated with statin intol   Sarcoidosis   Family history of coronary artery disease, (F died 87)   Obesity, unspecified    PLAN:  Myoview today.   Corine Shelter PA-C Beeper [phone] 01/11/2013, 10:00  AM

## 2013-01-12 NOTE — Progress Notes (Signed)
Utilization review completed.  

## 2013-12-12 ENCOUNTER — Other Ambulatory Visit: Payer: Self-pay | Admitting: Internal Medicine

## 2013-12-12 DIAGNOSIS — R140 Abdominal distension (gaseous): Secondary | ICD-10-CM

## 2013-12-20 ENCOUNTER — Ambulatory Visit
Admission: RE | Admit: 2013-12-20 | Discharge: 2013-12-20 | Disposition: A | Discharge status: Home / Self Care | Source: Ambulatory Visit | Attending: Internal Medicine | Admitting: Internal Medicine

## 2013-12-20 ENCOUNTER — Ambulatory Visit
Admission: RE | Admit: 2013-12-20 | Discharge: 2013-12-20 | Disposition: A | Discharge status: Home / Self Care | Payer: Medicare Other | Source: Ambulatory Visit | Attending: Internal Medicine | Admitting: Internal Medicine

## 2013-12-20 DIAGNOSIS — R140 Abdominal distension (gaseous): Secondary | ICD-10-CM

## 2015-12-11 ENCOUNTER — Other Ambulatory Visit: Payer: Self-pay | Admitting: Internal Medicine

## 2015-12-11 ENCOUNTER — Ambulatory Visit
Admission: RE | Admit: 2015-12-11 | Discharge: 2015-12-11 | Disposition: A | Discharge status: Home / Self Care | Payer: Medicare Other | Source: Ambulatory Visit | Attending: Internal Medicine | Admitting: Internal Medicine

## 2015-12-11 DIAGNOSIS — R059 Cough, unspecified: Secondary | ICD-10-CM

## 2015-12-11 DIAGNOSIS — R05 Cough: Secondary | ICD-10-CM

## 2015-12-17 ENCOUNTER — Other Ambulatory Visit: Payer: Self-pay | Admitting: Internal Medicine

## 2016-09-28 ENCOUNTER — Encounter: Payer: Self-pay | Admitting: Podiatry

## 2016-09-28 ENCOUNTER — Ambulatory Visit (HOSPITAL_BASED_OUTPATIENT_CLINIC_OR_DEPARTMENT_OTHER)
Admission: RE | Admit: 2016-09-28 | Discharge: 2016-09-28 | Disposition: A | Discharge status: Home / Self Care | Payer: Medicare Other | Source: Ambulatory Visit | Attending: Podiatry | Admitting: Podiatry

## 2016-09-28 ENCOUNTER — Ambulatory Visit (INDEPENDENT_AMBULATORY_CARE_PROVIDER_SITE_OTHER): Payer: Medicare Other | Admitting: Podiatry

## 2016-09-28 VITALS — BP 119/72 | HR 87 | Resp 18

## 2016-09-28 DIAGNOSIS — M79671 Pain in right foot: Secondary | ICD-10-CM | POA: Insufficient documentation

## 2016-09-28 DIAGNOSIS — M722 Plantar fascial fibromatosis: Secondary | ICD-10-CM | POA: Diagnosis present

## 2016-09-28 NOTE — Patient Instructions (Signed)

## 2016-09-28 NOTE — Progress Notes (Signed)
   Subjective:    Patient ID: Cheyenne Green, female    DOB: 09/04/1945, 71 y.o.   MRN: [id number]  HPI  71 year old female presents the office they for concerns her right heel pain which has been ongoing for the last 2 months. She states her pain has gotten better. She's had no recent treatment. She has pain if she sits in the computer for several hours and stands back up. She denies a numbness or tingling to her feet. She's had no recent injury or trauma. No other complaints today.  Review of Systems  All other systems reviewed and are negative.      Objective:   Physical Exam General: AAO x3, NAD  Dermatological: Skin is warm, dry and supple bilateral. Nails x 10 are well manicured; remaining integument appears unremarkable at this time. There are no open sores, no preulcerative lesions, no rash or signs of infection present.  Vascular: Dorsalis Pedis artery and Posterior Tibial artery pedal pulses are 2/4 bilateral with immedate capillary fill time. There is no pain with calf compression, swelling, warmth, erythema.   Neruologic: Grossly intact via light touch bilateral. Vibratory intact via tuning fork bilateral. Protective threshold with Semmes Wienstein monofilament intact to all pedal sites bilateral.  Musculoskeletal: There is mild tenderness to palpation along the plantar medial tubercle of the calcaneus at the insertion of plantar fascia on the right foot. There is no pain along the course of the plantar fascia within the arch of the foot. Plantar fascia appears to be intact. There is no pain with lateral compression of the calcaneus or pain with vibratory sensation. There is no pain along the course or insertion of the achilles tendon. No other areas of tenderness to bilateral lower extremities.  Muscular strength 5/5 in all groups tested bilateral.  Gait: Unassisted, Nonantalgic.     Assessment & Plan:  71 year old female right heel pain, plantar fasciitis/heel spur -Treatment  options discussed including all alternatives, risks, and complications -Etiology of symptoms were discussed  -X-rays were obtained and reviewed with the patient. -Declined steroid injection. Will also hold NSAIDs due to glaucoma.  -Plantar fascial brace dispensed -Shoe changes and discussed orthotics. She will go to [facility] [employer] to look at inserts. -Stretching and ice/heat daily -RTC 4 weeks or sooner if needed.   Ovid CurdMatthew Mauricio Dahlen, DPM

## 2016-09-29 DIAGNOSIS — M722 Plantar fascial fibromatosis: Secondary | ICD-10-CM | POA: Insufficient documentation

## 2016-10-19 ENCOUNTER — Encounter: Payer: Self-pay | Admitting: Podiatry

## 2016-10-19 ENCOUNTER — Ambulatory Visit (INDEPENDENT_AMBULATORY_CARE_PROVIDER_SITE_OTHER): Payer: Medicare Other | Admitting: Podiatry

## 2016-10-19 DIAGNOSIS — M722 Plantar fascial fibromatosis: Secondary | ICD-10-CM | POA: Diagnosis not present

## 2016-10-19 NOTE — Progress Notes (Signed)
Subjective: Ms. Thinnes presents to the office today for follow-up of right heel pain, plantar fasciitis. She states that it feels better but she has tightness along the achilles tendon. She states that the tendon does not hurt but it feels tight. Denies any swelling, redness, numbness, tingling.  Denies any systemic complaints such as fevers, chills, nausea, vomiting. No acute changes since last appointment, and no other complaints at this time.   Objective: AAO x3, NAD DP/PT pulses palpable bilaterally, CRT less than 3 seconds There is minimal tenderness to palpation along the plantar medial tubercle of the calcaneus at the insertion of plantar fascia on the right foot. There is no pain along the course of the plantar fascia within the arch of the foot. Plantar fascia appears to be intact. There is no pain with lateral compression of the calcaneus or pain with vibratory sensation. There is no pain along the course or insertion of the achilles tendon. No other areas of tenderness to bilateral lower extremities. Equinus is present No open lesions or pre-ulcerative lesions.  No pain with calf compression, swelling, warmth, erythema  Assessment: Right heel pain with resolving plantar fasciitis; equinus.   Plan: -All treatment options discussed with the patient including all alternatives, risks, complications.  -She wishes to hold off on any injection. She states that she is doing well and her pain has improved. Continue with stretching, icing daily and the plantar fascial brace. Dispensed night splint. Discussed shoe changes and orthotics.  -RTC in 6 weeks or sooner if needed.  -Patient encouraged to call the office with any questions, concerns, change in symptoms.   Ovid Curd, DPM

## 2016-10-26 ENCOUNTER — Ambulatory Visit: Payer: Medicare Other | Admitting: Podiatry

## 2016-12-07 ENCOUNTER — Encounter: Payer: Self-pay | Admitting: Podiatry

## 2016-12-07 ENCOUNTER — Ambulatory Visit (INDEPENDENT_AMBULATORY_CARE_PROVIDER_SITE_OTHER): Payer: Medicare Other | Admitting: Podiatry

## 2016-12-07 DIAGNOSIS — M722 Plantar fascial fibromatosis: Secondary | ICD-10-CM

## 2016-12-07 NOTE — Progress Notes (Signed)
Subjective: 71 year old female presents the office they for follow-up evaluation of right heel pain. She states that since last pulmonary heel pain is completely resolved. She's having no pain today she denies any swelling or redness she has no concerns today. She has been continuing stretching daily. Denies any systemic complaints such as fevers, chills, nausea, vomiting. No acute changes since last appointment, and no other complaints at this time.   Objective: AAO x3, NAD DP/PT pulses palpable bilaterally, CRT less than 3 seconds  at this time there is no tenderness palpation on the plantar medial tubercle of the calculus evidence for supine fashion is no pain along the course of the plantar fascia. There is no pain with lateral compression. There is no amount edema, erythema, increase in warmth. No areas of tenderness identified this time. No open lesions or pre-ulcerative lesions.  No pain with calf compression, swelling, warmth, erythema  Assessment: Resolved right heel pain, plantar fasciitis   Plan: -All treatment options discussed with the patient including all alternatives, risks, complications.  -At this point her pain is completely results. We discussed measures to help prevent recurrence. Discussed supportive shoes and orthotics. Also discussed with her continue with stretching exercises before and after activity. This any recurrent symptoms to follow-up otherwise I'll see her back as needed -Patient encouraged to call the office with any questions, concerns, change in symptoms.   Ovid CurdMatthew Wagoner, DPM

## 2019-08-25 ENCOUNTER — Ambulatory Visit: Payer: Medicare Other | Attending: Internal Medicine

## 2019-08-25 DIAGNOSIS — Z23 Encounter for immunization: Secondary | ICD-10-CM | POA: Insufficient documentation

## 2019-08-25 NOTE — Progress Notes (Signed)
   Covid-19 Vaccination Clinic  Name:  Cheyenne Green    MRN: [id number] DOB: [birth year]  08/25/2019  [name] was observed post Covid-19 immunization for 15 minutes without incidence. She was provided with Vaccine Information Sheet and instruction to access the V-Safe system.   [name] was instructed to call 911 with any severe reactions post vaccine: Marland Kitchen Difficulty breathing  . Swelling of your face and throat  . A fast heartbeat  . A bad rash all over your body  . Dizziness and weakness    Immunizations Administered    Name Date Dose VIS Date Route   Pfizer COVID-19 Vaccine 08/25/2019  1:27 PM 0.3 mL 06/22/2019 Intramuscular   Manufacturer: [employer], [employer]   Lot: [id number]   NDC: [address]-0

## 2019-09-17 ENCOUNTER — Ambulatory Visit: Payer: Medicare Other

## 2019-09-18 ENCOUNTER — Ambulatory Visit: Payer: Medicare Other | Attending: Internal Medicine

## 2019-09-18 DIAGNOSIS — Z23 Encounter for immunization: Secondary | ICD-10-CM | POA: Insufficient documentation

## 2019-09-18 NOTE — Progress Notes (Signed)
   Covid-19 Vaccination Clinic  Name:  Cheyenne Green    MRN: [id number] DOB: Jan 20, 1946  09/18/2019  Ms. [name] was observed post Covid-19 immunization for 15 minutes without incident. She was provided with Vaccine Information Sheet and instruction to access the V-Safe system.   [name] was instructed to call 911 with any severe reactions post vaccine: Marland Kitchen Difficulty breathing  . Swelling of face and throat  . A fast heartbeat  . A bad rash all over body  . Dizziness and weakness   Immunizations Administered    Name Date Dose VIS Date Route   Pfizer COVID-19 Vaccine 09/18/2019  8:09 AM 0.3 mL 06/22/2019 Intramuscular   Manufacturer: [employer], [employer]   Lot: [id number]   NDC: [address]-2

## 2021-10-08 ENCOUNTER — Other Ambulatory Visit: Payer: Self-pay | Admitting: Nephrology

## 2021-10-08 DIAGNOSIS — E559 Vitamin D deficiency, unspecified: Secondary | ICD-10-CM

## 2021-10-08 DIAGNOSIS — I129 Hypertensive chronic kidney disease with stage 1 through stage 4 chronic kidney disease, or unspecified chronic kidney disease: Secondary | ICD-10-CM

## 2021-10-08 DIAGNOSIS — N1831 Chronic kidney disease, stage 3a: Secondary | ICD-10-CM

## 2021-10-13 ENCOUNTER — Ambulatory Visit
Admission: RE | Admit: 2021-10-13 | Discharge: 2021-10-13 | Disposition: A | Discharge status: Home / Self Care | Payer: Medicare Other | Source: Ambulatory Visit | Attending: Nephrology | Admitting: Nephrology

## 2021-10-13 DIAGNOSIS — E1122 Type 2 diabetes mellitus with diabetic chronic kidney disease: Secondary | ICD-10-CM

## 2021-10-13 DIAGNOSIS — N1831 Chronic kidney disease, stage 3a: Secondary | ICD-10-CM

## 2021-10-13 DIAGNOSIS — I129 Hypertensive chronic kidney disease with stage 1 through stage 4 chronic kidney disease, or unspecified chronic kidney disease: Secondary | ICD-10-CM

## 2021-10-13 DIAGNOSIS — D631 Anemia in chronic kidney disease: Secondary | ICD-10-CM

## 2021-10-13 DIAGNOSIS — E559 Vitamin D deficiency, unspecified: Secondary | ICD-10-CM

## 2023-02-07 IMAGING — US US RENAL
1 series · 14 of 25 positions shown · non-contrast
Comparison: 12/20/2013

CLINICAL DATA: Stage 3a chronic kidney disease (CKD) (HCC), Type 2
diabetes mellitus with stage 3a chronic kidney disease, unspecified
whether long term insulin use (HCC), Hypertension, renal disease,
Anemia in stage 3a chronic kidney disease (HCC), Vitamin D
Deficiency

EXAM:
RENAL / URINARY TRACT ULTRASOUND COMPLETE

[Series 1: us renal · 0.23mm/px · 14 of 47 slices shown]
[im 1/47]
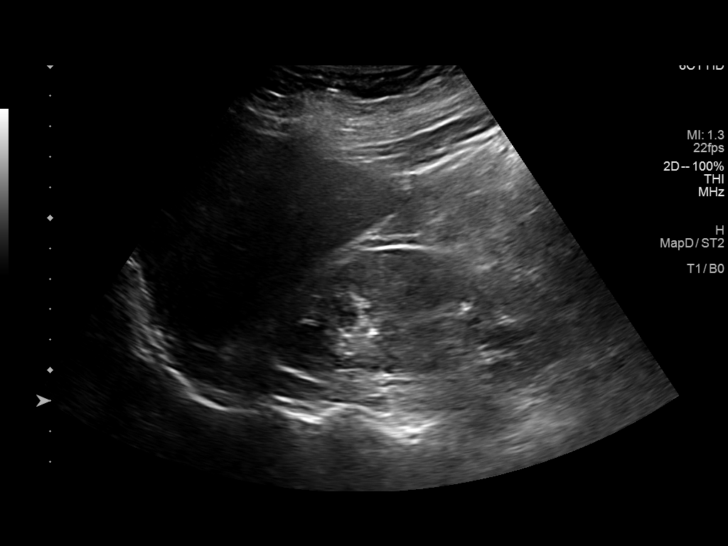
[im 4/47]
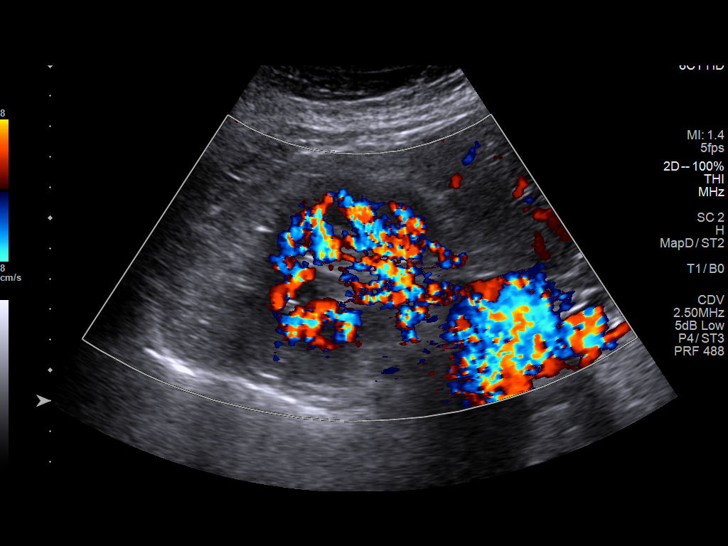
[im 8/47]
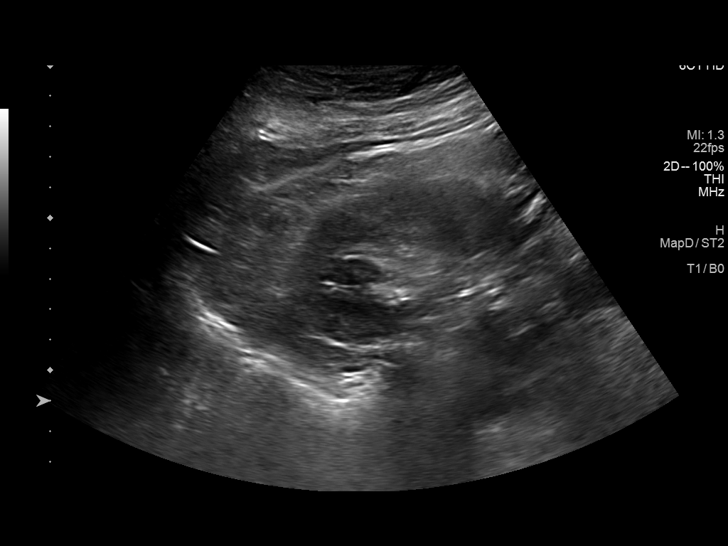
[im 12/47]
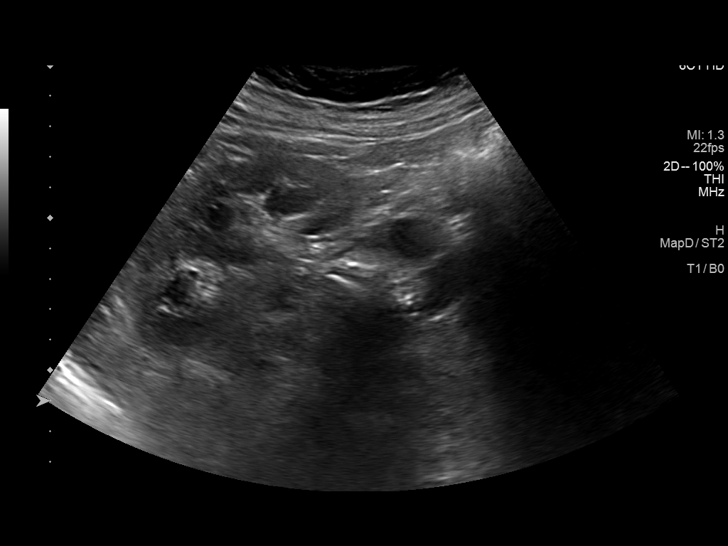
[im 16/47]
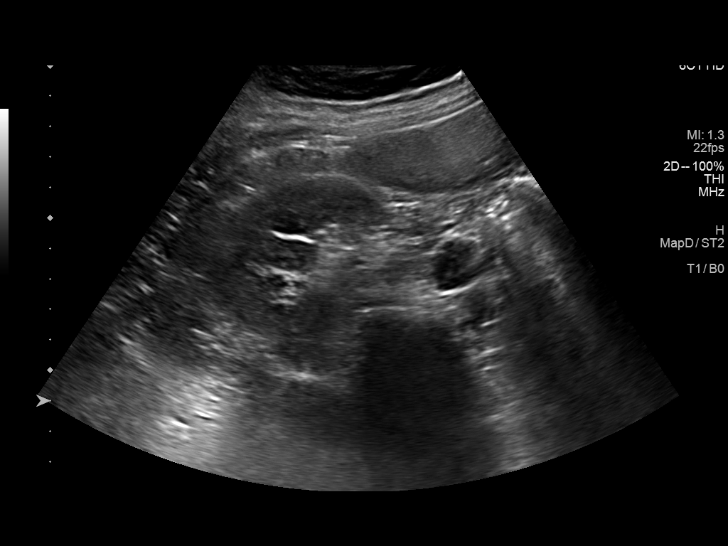
[im 18/47]
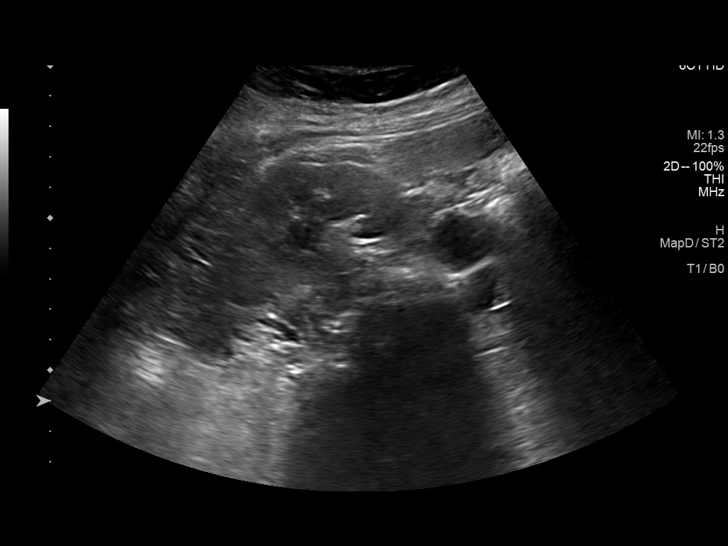
[im 22/47]
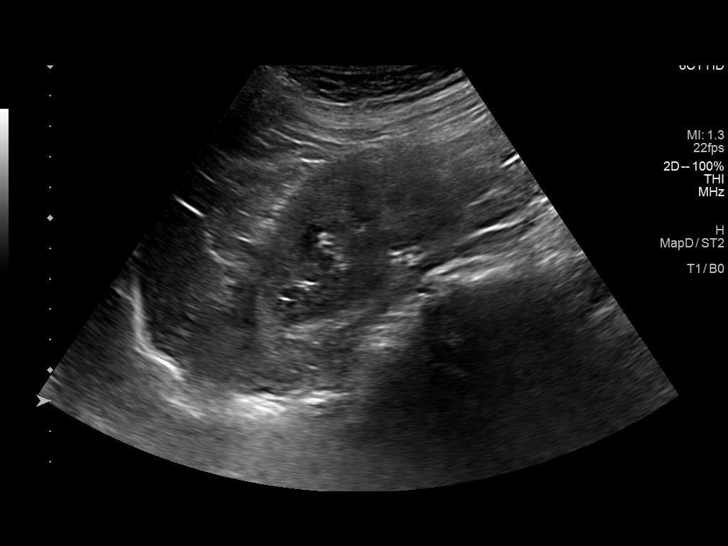
[im 25/47]
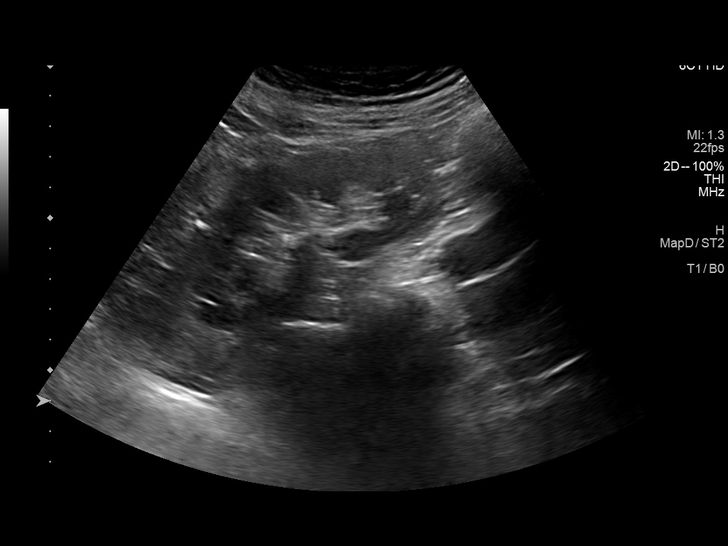
[im 29/47]
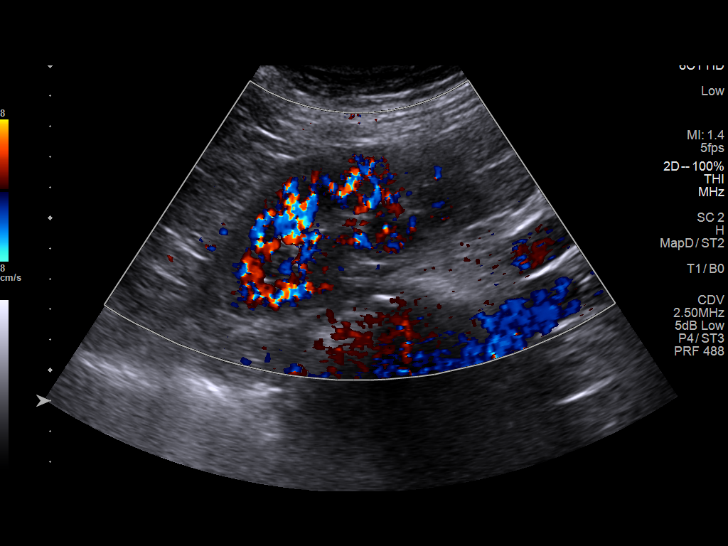
[im 31/47]
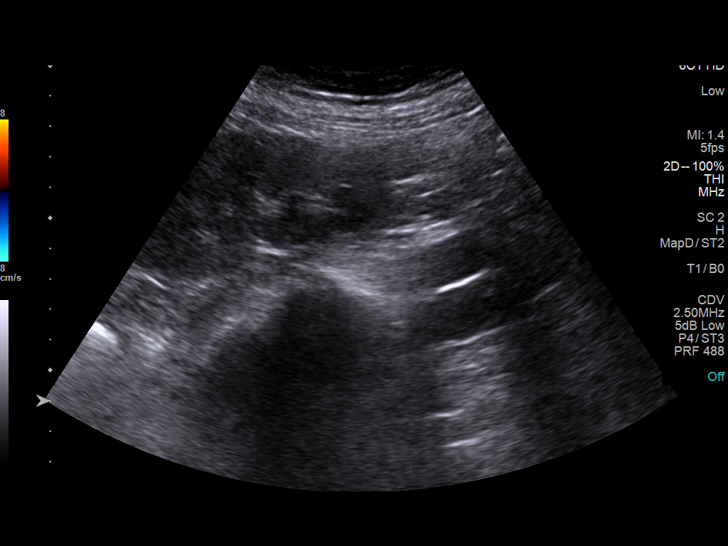
[im 35/47]
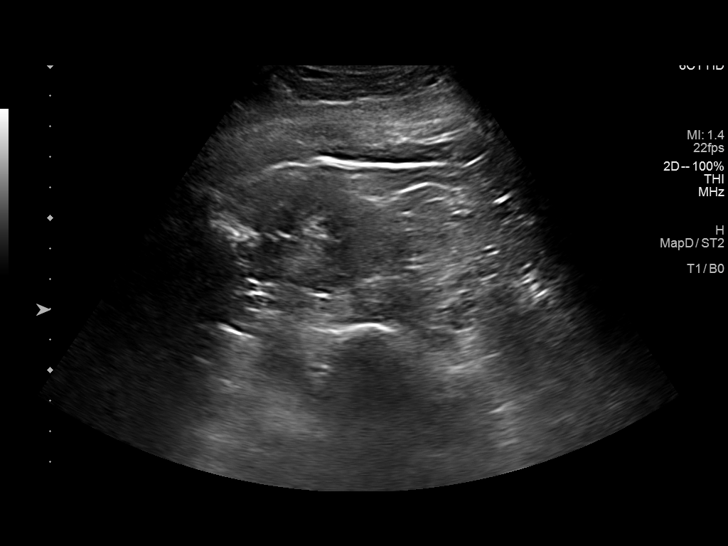
[im 39/47]
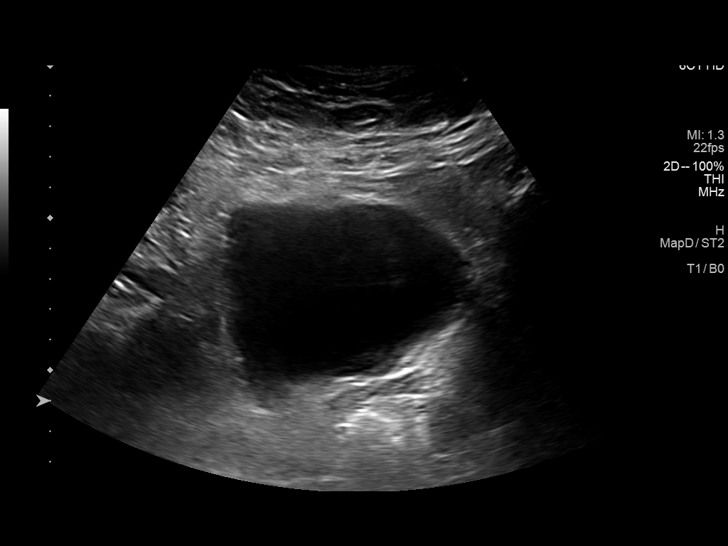
[im 43/47]
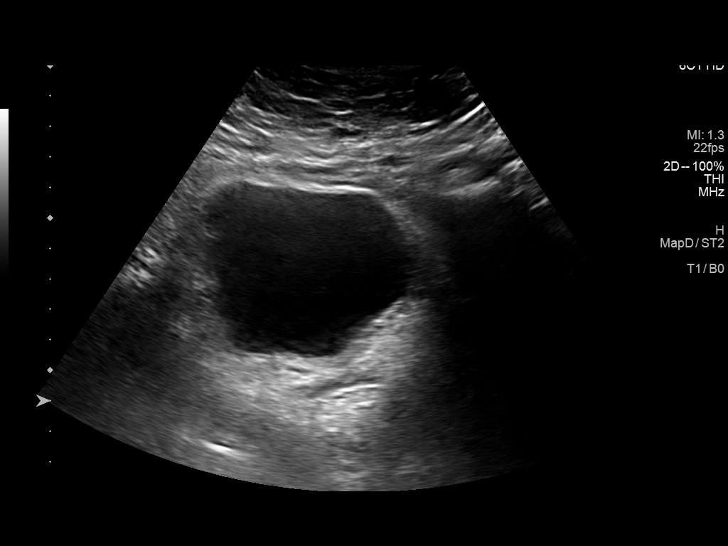
[im 47/47]
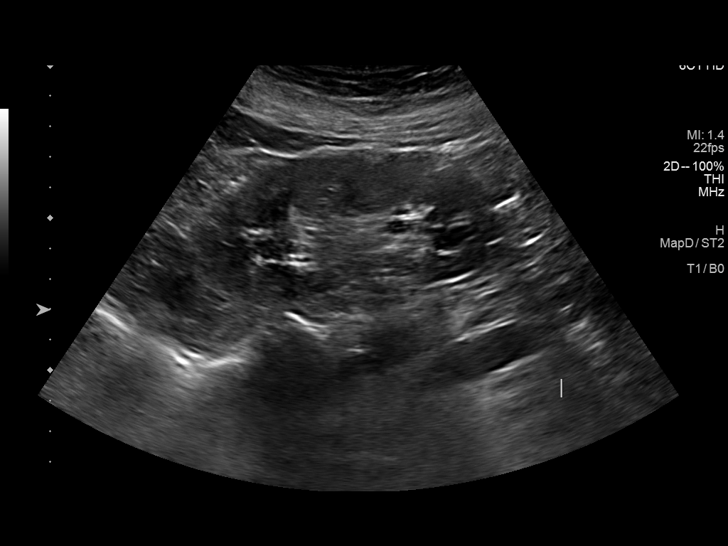

[14 of 25 positions shown; findings below may reference images not displayed]

FINDINGS: Right Kidney:

Renal measurements: 10.0 x 4.4 x 6.1 cm = volume: 140 mL.
Echogenicity within normal limits. No mass or hydronephrosis
visualized.

Left Kidney:

Renal measurements: 10.4 x 5.3 x 7.5 cm = volume: 213.4 mL.
Echogenicity within normal limits. No mass or hydronephrosis
visualized.

Bladder:

Appears normal for degree of bladder distention.

Other:

None.
IMPRESSION: 1. Unremarkable renal ultrasound.
